# Patient Record
Sex: Female | Born: 1970 | Race: White | Hispanic: No | Marital: Married | Smoking: Current some day smoker
Health system: Southern US, Community
[De-identification: ages and names within clinical notes are randomized; demographics above are authoritative.]

## PROBLEM LIST (undated history)

## (undated) DIAGNOSIS — R519 Headache, unspecified: Secondary | ICD-10-CM

## (undated) HISTORY — DX: Headache, unspecified: R51.9

---

## 2003-03-07 ENCOUNTER — Ambulatory Visit (HOSPITAL_BASED_OUTPATIENT_CLINIC_OR_DEPARTMENT_OTHER): Admission: RE | Admit: 2003-03-07 | Discharge: 2003-03-07 | Payer: Self-pay | Admitting: Urology

## 2003-03-07 ENCOUNTER — Ambulatory Visit (HOSPITAL_COMMUNITY): Admission: RE | Admit: 2003-03-07 | Discharge: 2003-03-07 | Payer: Self-pay | Admitting: Urology

## 2003-08-01 ENCOUNTER — Other Ambulatory Visit: Admission: RE | Admit: 2003-08-01 | Discharge: 2003-08-01 | Payer: Self-pay | Admitting: Obstetrics and Gynecology

## 2004-12-20 ENCOUNTER — Other Ambulatory Visit: Admission: RE | Admit: 2004-12-20 | Discharge: 2004-12-20 | Payer: Self-pay | Admitting: Obstetrics and Gynecology

## 2005-03-04 ENCOUNTER — Emergency Department (HOSPITAL_COMMUNITY): Admission: EM | Admit: 2005-03-04 | Discharge: 2005-03-04 | Payer: Self-pay | Admitting: *Deleted

## 2020-01-27 ENCOUNTER — Encounter: Payer: Self-pay | Admitting: Neurology

## 2020-03-10 NOTE — Progress Notes (Signed)
NEUROLOGY CONSULTATION NOTE  Hannah Hughes MRN: [id number] DOB: January 31, 1971  Referring provider: Sallyanne Havers, NP Primary care provider: No PCP  Reason for consult:  migraines   Subjective:  Hannah Hughes is a 50 year old right-handed female who presents for migraines.  History supplemented by referring provider's note.  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  She denies new headache, thunderclap headache or severe headache that wakes her from sleep. Aura:  Sees stars, sometimes prior to headache onset  Prodrome:  none Postdrome:  none Associated symptoms:  Nausea, vomiting, upset stomach, diarrhea, photophobia, phonophobia, dizziness.  She denies associated unilateral numbness or weakness. Has a persistent white noise in her head and left sided neck and head soreness. Duration:  They initially may last several days.  With topiramate, they now last a couple of hours but may have residual symptoms for rest of day.  Frequency:  daily Frequency of abortive medication: Excedrin 5 days a week Triggers:  Change in weather Relieving factors:  Sleep, caffeine Activity:  aggravates  Current NSAIDS/analgesics:  Excedrin migraine (takes 4 to 5 days a week) Current triptans:  Maxalt-MLT 10mg  (limits use due to drowsiness) Current ergotamine:  none Current anti-emetic:  none Current muscle relaxants:  none Current Antihypertensive medications:  none Current Antidepressant medications:  none Current Anticonvulsant medications:  topiramate 50mg  daily Current anti-CGRP:  none Current Vitamins/Herbal/Supplements:  magnesium Current Antihistamines/Decongestants:  Claritin Other therapy:  Daith piercing Hormone/birth control:  none  Past NSAIDS/analgesics:  Cambia 50mg  (effective - knocked out completely, but not covered by insurance) Past abortive triptans:   Sumatriptan (caused uncomfortable sensation in her head) Past abortive ergotamine:  none Past muscle relaxants:  none Past anti-emetic:  none Past antihypertensive medications:  Propranolol (hypotension) Past antidepressant medications:  none Past anticonvulsant medications:  none Past anti-CGRP:  none Past vitamins/Herbal/Supplements:  none Past antihistamines/decongestants:  none Other past therapies:  Trigger point injections behind her head  Caffeine:  2 cups of hot tea daily, no coffee or soda Diet:  Seltzer water, skips meals (decreased appetite) Exercise:  no Depression:  Controlled on Wellbutrin; Anxiety:  Stress.  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 Other pain:  no Sleep: Good.  9-10 PM to 4 AM Family history of headache:  Dad (migraines) Other family history:  Dad (hemorrhagic stroke)      PAST MEDICAL HISTORY: Past Medical History:  Diagnosis Date  . Headache     PAST SURGICAL HISTORY: History reviewed. No pertinent surgical history.   MEDICATIONS: Current Outpatient Medications on File Prior to Visit  Medication Sig Dispense Refill  . buPROPion (WELLBUTRIN XL) 150 MG 24 hr tablet Take 150 mg by mouth at bedtime.    loratadine (CLARITIN REDITABS) 10 MG dissolvable tablet Take by mouth.    . nitrofurantoin (MACRODANTIN) 25 MG capsule Take 25 mg by mouth as needed.    . rizatriptan (MAXALT-MLT) 10 MG disintegrating tablet Take 10 mg by mouth as needed.    . topiramate (TOPAMAX) 50 MG tablet Take 50 mg by mouth at bedtime.     No current facility-administered medications on file prior to visit.    ALLERGIES: No Known Allergies  FAMILY HISTORY: Family History  Problem Relation Age of Onset  . Migraines Father   . Stroke Father     SOCIAL HISTORY: Social  History   Socioeconomic History  . Marital status: Married    Spouse name: Not on  file  . Number of children: Not on file  . Years of education: Not on file  . Highest education level: Not on file  Occupational History  . Not on file  Tobacco Use  . Smoking status: Current Some Day Smoker    Types: Cigarettes  . Smokeless tobacco: Never Used  Vaping Use  . Vaping Use: Never used  Substance and Sexual Activity  . Alcohol use: Never  . Drug use: Never  . Sexual activity: Not on file  Other Topics Concern  . Not on file  Social History Narrative   Right handed   Social Determinants of Health   Financial Resource Strain: Not on file  Food Insecurity: Not on file  Transportation Needs: Not on file  Physical Activity: Not on file  Stress: Not on file  Social Connections: Not on file  Intimate Partner Violence: Not on file    Objective:  Blood pressure 128/84, pulse 85, height 5\' 3"  (1.6 m), weight 142 lb 12.8 oz (64.8 kg), SpO2 99 %. General: No acute distress.  Patient appears well-groomed.   Head:  Normocephalic/atraumatic, left suboccipital tenderness Eyes:  fundi examined but not visualized Neck: supple, no paraspinal tenderness, full range of motion Back: No paraspinal tenderness Heart: regular rate and rhythm Lungs: Clear to auscultation bilaterally. Vascular: No carotid bruits. Neurological Exam: Mental status: alert and oriented to person, place, and time, recent and remote memory intact, fund of knowledge intact, attention and concentration intact, speech fluent and not dysarthric, language intact. Cranial nerves: CN I: not tested CN II: pupils equal, round and reactive to light, visual fields intact CN III, IV, VI:  full range of motion, no nystagmus, no ptosis CN V: facial sensation intact. CN VII: upper and lower face symmetric CN VIII: hearing intact CN IX, X: gag intact, uvula midline CN XI: sternocleidomastoid and trapezius muscles intact CN XII: tongue midline Bulk & Tone: normal, no fasciculations. Motor:  muscle strength 5/5  throughout Sensation:  Pinprick, temperature and vibratory sensation intact. Deep Tendon Reflexes:  2+ throughout,  toes downgoing.   Finger to nose testing:  Without dysmetria.   Heel to shin:  Without dysmetria.   Gait:  Normal station and stride.  Romberg negative.  Assessment/Plan:   1.  Chronic migraine without aura, without status migrainosus, not intractable 2.  Migraine with aura  1.  Migraine prevention:  Start Emgality every 28 days.  Continue topiramate for now with plan to discontinue later. 2.  Migraine rescue:  She will try samples of Nurtec.  Zofran ODT 4mg  for nausea.  Stop Excedrin and rizatriptan 3.  Caffeine cessation 4.  Limit use of pain relievers to no more than 2 days out of week to prevent risk of rebound or medication-overuse headache. 5.  Keep headache diary 6.  Consider cylcobenzaprine at night PRN for suboccipital myofascial tenderness later if needed. 7.  Follow up 6 months.   Thank you for allowing me to take part in the care of this patient.  , DO

## 2020-03-11 ENCOUNTER — Other Ambulatory Visit: Payer: Self-pay

## 2020-03-11 ENCOUNTER — Encounter: Payer: Self-pay | Admitting: Neurology

## 2020-03-11 ENCOUNTER — Ambulatory Visit (INDEPENDENT_AMBULATORY_CARE_PROVIDER_SITE_OTHER): Payer: No Typology Code available for payment source | Admitting: Neurology

## 2020-03-11 VITALS — BP 128/84 | HR 85 | Ht 63.0 in | Wt 142.8 lb

## 2020-03-11 DIAGNOSIS — G43709 Chronic migraine without aura, not intractable, without status migrainosus: Secondary | ICD-10-CM

## 2020-03-11 DIAGNOSIS — G43109 Migraine with aura, not intractable, without status migrainosus: Secondary | ICD-10-CM

## 2020-03-11 MED ORDER — ONDANSETRON 4 MG PO TBDP: 4.0000 mg | ORAL_TABLET | Freq: Three times a day (TID) | ORAL | 5 refills | Status: AC | PRN

## 2020-03-11 MED ORDER — AIMOVIG 140 MG/ML ~~LOC~~ SOAJ
140.0000 mg | SUBCUTANEOUS | 5 refills | Status: DC
Start: 2020-03-11 — End: 2020-03-11

## 2020-03-11 MED ORDER — EMGALITY 120 MG/ML ~~LOC~~ SOAJ: 120.0000 mg | SUBCUTANEOUS | 5 refills | Status: AC

## 2020-03-11 NOTE — Patient Instructions (Addendum)
  1. Start Emgality every 28 days.  Continue topiramate 50mg  daily for now. 2. STOP EXCEDRIN AND RIZATRIPTAN.  Take Nurtec dissolvable tablet at earliest onset of headache.  Maximum 1 tablets in 24 hours. 3. Ondansetron dissolvable tablet for nausea 4. Limit use of pain relievers to no more than 2 days out of the week.  These medications include acetaminophen, NSAIDs (ibuprofen/Advil/Motrin, naproxen/Aleve, triptans (Imitrex/sumatriptan), Excedrin, and narcotics.  This will help reduce risk of rebound headaches. 5. Be aware of common food triggers:  - Caffeine:  coffee, black tea, cola, Mt. Dew  - Chocolate  - Dairy:  aged cheeses (brie, blue, cheddar, gouda, [place], provolone, [place], Swiss, etc), chocolate milk, buttermilk, sour cream, limit eggs and yogurt  - Nuts, peanut butter  - Alcohol  - Cereals/grains:  FRESH breads (fresh bagels, sourdough, doughnuts), yeast productions  - Processed/canned/aged/cured meats (pre-packaged deli meats, hotdogs)  - MSG/glutamate:  soy sauce, flavor enhancer, pickled/preserved/marinated foods  - Sweeteners:  aspartame (Equal, Nutrasweet).  Sugar and Splenda are okay  - Vegetables:  legumes (lima beans, lentils, snow peas, fava beans, pinto peans, peas, garbanzo beans), sauerkraut, onions, olives, pickles  - Fruit:  avocados, bananas, citrus fruit (orange, lemon, grapefruit), mango  - Other:  Frozen meals, macaroni and cheese 6. Routine exercise 7. Stay adequately hydrated (aim for 64 oz water daily) 8. Keep headache diary 9. Maintain proper stress management 10. Maintain proper sleep hygiene 11. Do not skip meals 12. Consider supplements:  magnesium citrate 400mg  daily, riboflavin 400mg  daily, coenzyme Q10 100mg  three times daily.

## 2020-03-16 ENCOUNTER — Telehealth: Payer: Self-pay | Admitting: Neurology

## 2020-03-16 NOTE — Telephone Encounter (Signed)
Patient called in stating her insurance is not going to cover the [employer] She would like to find something else that is more affordable to take. Her insurance will only pay for generic prescriptions.

## 2020-03-16 NOTE — Telephone Encounter (Signed)
Pt states she wanted to see what else she can go on. Also they may not cover the new medications.   Please advise.

## 2020-03-17 NOTE — Telephone Encounter (Signed)
Please increase topiramate to 100mg  at bedtime.  She should keep a headache diary.  If no significant improvement in 6 weeks, she should contact .

## 2020-03-17 NOTE — Telephone Encounter (Signed)
LMOVM to call the office back.

## 2020-03-18 MED ORDER — TOPIRAMATE 50 MG PO TABS: 100.0000 mg | ORAL_TABLET | Freq: Every day | ORAL | 0 refills | Status: DC

## 2020-03-18 NOTE — Telephone Encounter (Signed)
Pt advised to increase her Topiramate to 100 at bedtime. New script sent into pharmacy.

## 2020-03-18 NOTE — Telephone Encounter (Signed)
Patient returned call to Sheena. 

## 2020-04-23 ENCOUNTER — Other Ambulatory Visit: Payer: Self-pay | Admitting: Neurology

## 2020-09-15 ENCOUNTER — Ambulatory Visit: Payer: No Typology Code available for payment source | Admitting: Neurology

## 2020-09-20 ENCOUNTER — Other Ambulatory Visit: Payer: Self-pay | Admitting: Neurology

## 2021-02-22 ENCOUNTER — Other Ambulatory Visit: Payer: Self-pay | Admitting: Neurology

## 2021-03-01 ENCOUNTER — Ambulatory Visit: Payer: No Typology Code available for payment source | Admitting: Neurology

## 2021-03-24 ENCOUNTER — Other Ambulatory Visit: Payer: Self-pay | Admitting: Neurology
